# Patient Record
Sex: Male | Born: 1995 | Race: White | Hispanic: No | Marital: Single | Smoking: Never smoker
Health system: Southern US, Community
[De-identification: ages and names within clinical notes are randomized; demographics above are authoritative.]

## PROBLEM LIST (undated history)

## (undated) ENCOUNTER — Ambulatory Visit (HOSPITAL_COMMUNITY): Payer: Self-pay

## (undated) HISTORY — PX: DENTAL SURGERY: SHX609

## (undated) HISTORY — PX: WISDOM TOOTH EXTRACTION: SHX21

---
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
## 2024-11-03 VITALS — BP 119/80 | HR 107 | Temp 98.4°F | Resp 18 | Ht 69.0 in | Wt 135.0 lb

## 2024-11-03 DIAGNOSIS — S61451A Open bite of right hand, initial encounter: Secondary | ICD-10-CM

## 2024-11-03 DIAGNOSIS — W5501XA Bitten by cat, initial encounter: Secondary | ICD-10-CM | POA: Diagnosis not present

## 2024-11-03 MED ORDER — AMOXICILLIN-POT CLAVULANATE 875-125 MG PO TABS: 1.0000 | ORAL_TABLET | Freq: Two times a day (BID) | ORAL | 0 refills | Status: AC

## 2024-11-03 MED ORDER — RABIES VIRUS VACCINE, HDC IM SUSR
1.0000 mL | Freq: Once | INTRAMUSCULAR | Status: AC
  Administered 2024-11-03: 1 mL via INTRAMUSCULAR

## 2024-11-03 MED ORDER — TETANUS-DIPHTH-ACELL PERTUSSIS 5-2-15.5 LF-MCG/0.5 IM SUSP
0.5000 mL | Freq: Once | INTRAMUSCULAR | Status: AC
  Administered 2024-11-03: 0.5 mL via INTRAMUSCULAR

## 2024-11-03 NOTE — ED Provider Notes (Signed)
 [facility]    CSN: [id number] Arrival date & time: 11/03/24  1052      History   Chief Complaint Chief Complaint  Patient presents with   Animal Bite    HPI Henry Wilson is a 28 y.o. male.   Pt presents today due to multiple cat bites of right hand. Pt states that he was playing with a cute stray cat at a wedding and he attempted to get the cat in a carrier so that the best man can take him to vet but cat bit patient and ran away. Pt has a hx of rabies series in the past, states that he just needs booster. Pt cannot remember last tetanus immunization. Wedding was in [place], [place] (about 40 mins from [place]). Animal control was not contacted but venue is still looking for cat because the best man wants to take it home.   The history is provided by the patient.  Animal Bite   History reviewed. No pertinent past medical history.  There are no active problems to display for this patient.   Past Surgical History:  Procedure Laterality Date   DENTAL SURGERY     cyst removal    WISDOM TOOTH EXTRACTION         Home Medications    Prior to Admission medications   Medication Sig Start Date End Date Taking? Authorizing Provider  amoxicillin-clavulanate (AUGMENTIN) 875-125 MG tablet Take 1 tablet by mouth every 12 (twelve) hours. 11/03/24  Yes Andra Corean BROCKS, PA-C    Family History History reviewed. No pertinent family history.  Social History Social History   Tobacco Use   Smoking status: Never   Smokeless tobacco: Never  Vaping Use   Vaping status: Never Used  Substance Use Topics   Alcohol use: No   Drug use: No     Allergies   Patient has no known allergies.   Review of Systems Review of Systems   Physical Exam Triage Vital Signs ED Triage Vitals  Encounter Vitals Group     BP 11/03/24 1111 119/80     Girls Systolic BP Percentile --      Girls Diastolic BP Percentile --      Boys Systolic BP Percentile --      Boys  Diastolic BP Percentile --      Pulse Rate 11/03/24 1111 (!) 107     Resp 11/03/24 1111 18     Temp 11/03/24 1111 98.4 F (36.9 C)     Temp Source 11/03/24 1111 Oral     SpO2 11/03/24 1111 98 %     Weight 11/03/24 1110 135 lb (61.2 kg)     Height 11/03/24 1110 5' 9 (1.753 m)     Head Circumference --      Peak Flow --      Pain Score 11/03/24 1110 0     Pain Loc --      Pain Education --      Exclude from Growth Chart --    No data found.  Updated Vital Signs BP 119/80 (BP Location: Left Arm)   Pulse (!) 107   Temp 98.4 F (36.9 C) (Oral)   Resp 18   Ht 5' 9 (1.753 m)   Wt 135 lb (61.2 kg)   SpO2 98%   BMI 19.94 kg/m   Visual Acuity Right Eye Distance:   Left Eye Distance:   Bilateral Distance:    Right Eye Near:   Left Eye Near:  Bilateral Near:     Physical Exam Vitals and nursing note reviewed.  Constitutional:      General: He is not in acute distress.    Appearance: Normal appearance. He is not ill-appearing, toxic-appearing or diaphoretic.  Eyes:     General: No scleral icterus. Cardiovascular:     Rate and Rhythm: Normal rate and regular rhythm.     Heart sounds: Normal heart sounds.  Pulmonary:     Effort: Pulmonary effort is normal. No respiratory distress.     Breath sounds: Normal breath sounds. No wheezing or rhonchi.  Musculoskeletal:     Comments: Multiple puncture wounds noted of right hand  Skin:    General: Skin is warm.  Neurological:     Mental Status: He is alert and oriented to person, place, and time.  Psychiatric:        Mood and Affect: Mood normal.        Behavior: Behavior normal.      UC Treatments / Results  Labs (all labs ordered are listed, but only abnormal results are displayed) Labs Reviewed - No data to display  EKG   Radiology No results found.  Procedures Procedures (including critical care time)  Medications Ordered in UC Medications  Tdap (ADACEL) injection 0.5 mL (0.5 mLs Intramuscular Given  11/03/24 1142)  rabies vaccine, human diploid (IMOVAX) injection 1 mL (1 mL Intramuscular Given 11/03/24 1143)    Initial Impression / Assessment and Plan / UC Course  I have reviewed the triage vital signs and the nursing notes.  Pertinent labs & imaging results that were available during my care of the patient were reviewed by me and considered in my medical decision making (see chart for details).     Tetanus was updated today, prophylactic abx prescribed, and tetanus booster was given today. Final Clinical Impressions(s) / UC Diagnoses   Final diagnoses:  Open wound of right hand due to cat bite     Discharge Instructions      Return on Wednesday for second booster shot.   You have been diagnosed with an abrasion/laceration that requires wound care.  -Use you have the area clean and dry -Change your bandage at least daily, keep bandage clean and dry.  If bandages become soiled or wet they need to be changed soon as possible. -Will get wound daily, if you notice color discharge, foul odor, or pale/gray/black discoloration of skin, increased redness, pain, or swelling you need to seek attention immediately. -You may use ibuprofen  and Tylenol  as needed for pain control.      ED Prescriptions     Medication Sig Dispense Auth. Provider   amoxicillin-clavulanate (AUGMENTIN) 875-125 MG tablet Take 1 tablet by mouth every 12 (twelve) hours. 14 tablet Andra Corean BROCKS, PA-C      PDMP not reviewed this encounter.   Andra Corean BROCKS, PA-C 11/03/24 1151

## 2024-11-03 NOTE — ED Triage Notes (Signed)
 Cat bite/scratch, need prophylactic rabies shots, had a series of rabies shots about 15 years ago - Entered by patient  Pt reports he was at a [place] venue and picked up a stray cat and was bitten on his right hand. Patient was bitten on right hand and a couple of puncture wounds.  This happened last night.

## 2024-11-03 NOTE — Discharge Instructions (Addendum)
 Return on Wednesday for second booster shot.   You have been diagnosed with an abrasion/laceration that requires wound care.  -Use you have the area clean and dry -Change your bandage at least daily, keep bandage clean and dry.  If bandages become soiled or wet they need to be changed soon as possible. -Will get wound daily, if you notice color discharge, foul odor, or pale/gray/black discoloration of skin, increased redness, pain, or swelling you need to seek attention immediately. -You may use ibuprofen  and Tylenol  as needed for pain control.
